# Patient Record
Sex: Female | Born: 1950 | Race: White | Hispanic: Refuse to answer | Marital: Married | Smoking: Never smoker
Health system: Southern US, Community
[De-identification: ages and names within clinical notes are randomized; demographics above are authoritative.]

## PROBLEM LIST (undated history)

## (undated) DIAGNOSIS — Z972 Presence of dental prosthetic device (complete) (partial): Secondary | ICD-10-CM

## (undated) DIAGNOSIS — I2510 Atherosclerotic heart disease of native coronary artery without angina pectoris: Secondary | ICD-10-CM

## (undated) DIAGNOSIS — R7303 Prediabetes: Secondary | ICD-10-CM

## (undated) DIAGNOSIS — I219 Acute myocardial infarction, unspecified: Secondary | ICD-10-CM

## (undated) DIAGNOSIS — Z860101 Personal history of adenomatous and serrated colon polyps: Secondary | ICD-10-CM

## (undated) DIAGNOSIS — I2119 ST elevation (STEMI) myocardial infarction involving other coronary artery of inferior wall: Secondary | ICD-10-CM

## (undated) DIAGNOSIS — E785 Hyperlipidemia, unspecified: Secondary | ICD-10-CM

## (undated) DIAGNOSIS — H409 Unspecified glaucoma: Secondary | ICD-10-CM

## (undated) HISTORY — PX: EYE SURGERY: SHX253

## (undated) HISTORY — PX: ERCP: SHX60

## (undated) HISTORY — PX: CHOLECYSTECTOMY: SHX55

## (undated) HISTORY — PX: COLONOSCOPY: SHX174

## (undated) HISTORY — PX: LIPOMA EXCISION: SHX5283

---

## 2004-09-16 ENCOUNTER — Ambulatory Visit: Payer: Self-pay | Admitting: Internal Medicine

## 2005-05-12 ENCOUNTER — Ambulatory Visit: Payer: Self-pay | Admitting: Unknown Physician Specialty

## 2006-07-07 ENCOUNTER — Ambulatory Visit: Payer: Self-pay | Admitting: Ophthalmology

## 2007-11-02 ENCOUNTER — Ambulatory Visit: Payer: Self-pay | Admitting: Internal Medicine

## 2008-11-07 ENCOUNTER — Ambulatory Visit: Payer: Self-pay | Admitting: Internal Medicine

## 2009-12-26 ENCOUNTER — Ambulatory Visit: Payer: Self-pay | Admitting: Internal Medicine

## 2011-01-29 ENCOUNTER — Ambulatory Visit: Payer: Self-pay | Admitting: Internal Medicine

## 2011-12-04 ENCOUNTER — Ambulatory Visit: Payer: Self-pay | Admitting: Internal Medicine

## 2011-12-09 ENCOUNTER — Ambulatory Visit: Payer: Self-pay | Admitting: Internal Medicine

## 2011-12-09 LAB — CREATININE, SERUM
EGFR (African American): >60
EGFR (Non-African Amer.): >60

## 2011-12-10 ENCOUNTER — Ambulatory Visit: Payer: Self-pay | Admitting: Gastroenterology

## 2011-12-14 LAB — PATHOLOGY REPORT

## 2012-03-19 ENCOUNTER — Ambulatory Visit: Payer: Self-pay | Admitting: Unknown Physician Specialty

## 2012-03-28 ENCOUNTER — Ambulatory Visit: Payer: Self-pay | Admitting: Internal Medicine

## 2013-04-04 ENCOUNTER — Ambulatory Visit: Payer: Self-pay | Admitting: Internal Medicine

## 2013-04-11 ENCOUNTER — Ambulatory Visit: Payer: Self-pay | Admitting: Internal Medicine

## 2014-04-09 ENCOUNTER — Ambulatory Visit: Payer: Self-pay | Admitting: Internal Medicine

## 2015-02-25 ENCOUNTER — Other Ambulatory Visit: Payer: Self-pay | Admitting: Internal Medicine

## 2015-02-25 DIAGNOSIS — Z1231 Encounter for screening mammogram for malignant neoplasm of breast: Secondary | ICD-10-CM

## 2015-04-07 HISTORY — PX: CORONARY ANGIOPLASTY: SHX604

## 2015-04-11 ENCOUNTER — Encounter (INDEPENDENT_AMBULATORY_CARE_PROVIDER_SITE_OTHER): Payer: Self-pay

## 2015-04-11 ENCOUNTER — Ambulatory Visit
Admission: RE | Admit: 2015-04-11 | Discharge: 2015-04-11 | Disposition: A | Discharge status: Home / Self Care | Payer: Medicare Other | Source: Ambulatory Visit | Attending: Internal Medicine | Admitting: Internal Medicine

## 2015-04-11 ENCOUNTER — Other Ambulatory Visit: Payer: Self-pay | Admitting: Internal Medicine

## 2015-04-11 DIAGNOSIS — Z1231 Encounter for screening mammogram for malignant neoplasm of breast: Secondary | ICD-10-CM | POA: Diagnosis present

## 2015-07-06 HISTORY — PX: CARDIAC CATHETERIZATION: SHX172

## 2016-02-19 ENCOUNTER — Other Ambulatory Visit: Payer: Self-pay | Admitting: Internal Medicine

## 2016-02-19 DIAGNOSIS — Z1231 Encounter for screening mammogram for malignant neoplasm of breast: Secondary | ICD-10-CM

## 2016-04-14 ENCOUNTER — Ambulatory Visit
Admission: RE | Admit: 2016-04-14 | Discharge: 2016-04-14 | Disposition: A | Discharge status: Home / Self Care | Payer: Medicare Other | Source: Ambulatory Visit | Attending: Internal Medicine | Admitting: Internal Medicine

## 2016-04-14 DIAGNOSIS — Z1231 Encounter for screening mammogram for malignant neoplasm of breast: Secondary | ICD-10-CM | POA: Diagnosis present

## 2016-04-15 ENCOUNTER — Other Ambulatory Visit: Payer: Self-pay | Admitting: Internal Medicine

## 2016-04-15 DIAGNOSIS — R928 Other abnormal and inconclusive findings on diagnostic imaging of breast: Secondary | ICD-10-CM

## 2016-04-15 DIAGNOSIS — N6310 Unspecified lump in the right breast, unspecified quadrant: Secondary | ICD-10-CM

## 2016-05-12 ENCOUNTER — Ambulatory Visit
Admission: RE | Admit: 2016-05-12 | Discharge: 2016-05-12 | Disposition: A | Discharge status: Home / Self Care | Payer: Medicare Other | Source: Ambulatory Visit | Attending: Internal Medicine | Admitting: Internal Medicine

## 2016-05-12 DIAGNOSIS — N641 Fat necrosis of breast: Secondary | ICD-10-CM | POA: Insufficient documentation

## 2016-05-12 DIAGNOSIS — N6310 Unspecified lump in the right breast, unspecified quadrant: Secondary | ICD-10-CM

## 2016-05-12 DIAGNOSIS — R928 Other abnormal and inconclusive findings on diagnostic imaging of breast: Secondary | ICD-10-CM | POA: Diagnosis present

## 2016-05-13 ENCOUNTER — Other Ambulatory Visit: Payer: Self-pay | Admitting: Internal Medicine

## 2016-05-13 DIAGNOSIS — N6310 Unspecified lump in the right breast, unspecified quadrant: Secondary | ICD-10-CM

## 2016-08-12 ENCOUNTER — Ambulatory Visit: Payer: Medicare Other

## 2016-08-12 ENCOUNTER — Other Ambulatory Visit: Payer: Medicare Other

## 2016-08-13 ENCOUNTER — Ambulatory Visit
Admission: RE | Admit: 2016-08-13 | Discharge: 2016-08-13 | Disposition: A | Discharge status: Home / Self Care | Payer: Medicare Other | Source: Ambulatory Visit | Attending: Internal Medicine | Admitting: Internal Medicine

## 2016-08-13 DIAGNOSIS — N6310 Unspecified lump in the right breast, unspecified quadrant: Secondary | ICD-10-CM

## 2017-03-10 ENCOUNTER — Other Ambulatory Visit: Payer: Self-pay | Admitting: Internal Medicine

## 2017-03-10 DIAGNOSIS — Z1231 Encounter for screening mammogram for malignant neoplasm of breast: Secondary | ICD-10-CM

## 2017-04-28 ENCOUNTER — Ambulatory Visit
Admission: RE | Admit: 2017-04-28 | Discharge: 2017-04-28 | Disposition: A | Discharge status: Home / Self Care | Payer: Medicare Other | Source: Ambulatory Visit | Attending: Internal Medicine | Admitting: Internal Medicine

## 2017-04-28 DIAGNOSIS — Z1231 Encounter for screening mammogram for malignant neoplasm of breast: Secondary | ICD-10-CM | POA: Insufficient documentation

## 2017-11-16 ENCOUNTER — Encounter: Payer: Self-pay | Admitting: *Deleted

## 2017-11-17 ENCOUNTER — Ambulatory Visit: Payer: Medicare Other | Admitting: Anesthesiology

## 2017-11-17 ENCOUNTER — Ambulatory Visit
Admission: RE | Admit: 2017-11-17 | Discharge: 2017-11-17 | Disposition: A | Discharge status: Home / Self Care | Payer: Medicare Other | Source: Ambulatory Visit | Attending: Unknown Physician Specialty | Admitting: Unknown Physician Specialty

## 2017-11-17 ENCOUNTER — Encounter: Payer: Self-pay | Admitting: Anesthesiology

## 2017-11-17 ENCOUNTER — Encounter
Admission: RE | Disposition: A | Discharge status: Home / Self Care | Payer: Self-pay | Source: Ambulatory Visit | Attending: Unknown Physician Specialty

## 2017-11-17 DIAGNOSIS — K640 First degree hemorrhoids: Secondary | ICD-10-CM | POA: Insufficient documentation

## 2017-11-17 DIAGNOSIS — Z79899 Other long term (current) drug therapy: Secondary | ICD-10-CM | POA: Diagnosis not present

## 2017-11-17 DIAGNOSIS — Z7982 Long term (current) use of aspirin: Secondary | ICD-10-CM | POA: Insufficient documentation

## 2017-11-17 DIAGNOSIS — I2510 Atherosclerotic heart disease of native coronary artery without angina pectoris: Secondary | ICD-10-CM | POA: Insufficient documentation

## 2017-11-17 DIAGNOSIS — Z881 Allergy status to other antibiotic agents status: Secondary | ICD-10-CM | POA: Diagnosis not present

## 2017-11-17 DIAGNOSIS — Z880 Allergy status to penicillin: Secondary | ICD-10-CM | POA: Diagnosis not present

## 2017-11-17 DIAGNOSIS — Z86010 Personal history of colonic polyps: Secondary | ICD-10-CM | POA: Insufficient documentation

## 2017-11-17 DIAGNOSIS — Z955 Presence of coronary angioplasty implant and graft: Secondary | ICD-10-CM | POA: Insufficient documentation

## 2017-11-17 DIAGNOSIS — Z1211 Encounter for screening for malignant neoplasm of colon: Secondary | ICD-10-CM | POA: Diagnosis not present

## 2017-11-17 DIAGNOSIS — I252 Old myocardial infarction: Secondary | ICD-10-CM | POA: Diagnosis not present

## 2017-11-17 DIAGNOSIS — H409 Unspecified glaucoma: Secondary | ICD-10-CM | POA: Insufficient documentation

## 2017-11-17 DIAGNOSIS — K5730 Diverticulosis of large intestine without perforation or abscess without bleeding: Secondary | ICD-10-CM | POA: Diagnosis not present

## 2017-11-17 HISTORY — DX: Atherosclerotic heart disease of native coronary artery without angina pectoris: I25.10

## 2017-11-17 HISTORY — DX: Unspecified glaucoma: H40.9

## 2017-11-17 HISTORY — DX: Acute myocardial infarction, unspecified: I21.9

## 2017-11-17 HISTORY — PX: COLONOSCOPY WITH PROPOFOL: SHX5780

## 2017-11-17 SURGERY — COLONOSCOPY WITH PROPOFOL
Anesthesia: General

## 2017-11-17 MED ORDER — LIDOCAINE HCL (PF) 2 % IJ SOLN
INTRAMUSCULAR | Status: AC
  Filled 2017-11-17: qty 10

## 2017-11-17 MED ORDER — PROPOFOL 500 MG/50ML IV EMUL
INTRAVENOUS | Status: AC
  Filled 2017-11-17: qty 50

## 2017-11-17 MED ORDER — LIDOCAINE HCL (PF) 1 % IJ SOLN
INTRAMUSCULAR | Status: AC
  Filled 2017-11-17: qty 2

## 2017-11-17 MED ORDER — SODIUM CHLORIDE 0.9 % IV SOLN
INTRAVENOUS | Status: DC
  Administered 2017-11-17: 11:00:00 via INTRAVENOUS

## 2017-11-17 MED ORDER — PROPOFOL 10 MG/ML IV BOLUS
INTRAVENOUS | Status: DC | PRN
  Administered 2017-11-17: 20 mg via INTRAVENOUS
  Administered 2017-11-17: 30 mg via INTRAVENOUS

## 2017-11-17 MED ORDER — FENTANYL CITRATE (PF) 100 MCG/2ML IJ SOLN
INTRAMUSCULAR | Status: DC | PRN
  Administered 2017-11-17 (×2): 50 ug via INTRAVENOUS

## 2017-11-17 MED ORDER — PROPOFOL 500 MG/50ML IV EMUL
INTRAVENOUS | Status: DC | PRN
  Administered 2017-11-17: 50 ug/kg/min via INTRAVENOUS

## 2017-11-17 MED ORDER — LIDOCAINE HCL (PF) 1 % IJ SOLN: 2.0000 mL | Freq: Once | INTRAMUSCULAR | Status: DC

## 2017-11-17 MED ORDER — MIDAZOLAM HCL 5 MG/5ML IJ SOLN
INTRAMUSCULAR | Status: DC | PRN
  Administered 2017-11-17: 2 mg via INTRAVENOUS

## 2017-11-17 MED ORDER — FENTANYL CITRATE (PF) 100 MCG/2ML IJ SOLN
INTRAMUSCULAR | Status: AC
  Filled 2017-11-17: qty 2

## 2017-11-17 MED ORDER — LIDOCAINE HCL (PF) 2 % IJ SOLN
INTRAMUSCULAR | Status: DC | PRN
  Administered 2017-11-17: 60 mg

## 2017-11-17 MED ORDER — MIDAZOLAM HCL 2 MG/2ML IJ SOLN
INTRAMUSCULAR | Status: AC
  Filled 2017-11-17: qty 2

## 2017-11-17 NOTE — Anesthesia Postprocedure Evaluation (Signed)
Anesthesia Post Note  Patient: Autumn RacerJanet W Combs  Procedure(s) Performed: COLONOSCOPY WITH PROPOFOL (N/A )  Patient location during evaluation: Endoscopy Anesthesia Type: General Level of consciousness: awake and alert Pain management: pain level controlled Vital Signs Assessment: post-procedure vital signs reviewed and stable Respiratory status: spontaneous breathing, nonlabored ventilation, respiratory function stable and patient connected to nasal cannula oxygen Cardiovascular status: blood pressure returned to baseline and stable Postop Assessment: no apparent nausea or vomiting Anesthetic complications: no     Last Vitals:  Vitals:   11/17/17 1205 11/17/17 1215  BP: 106/71 (!) 99/47  Pulse: 65 60  Resp: 17 (!) 9  Temp:    SpO2: 97% 99%    Last Pain:  Vitals:   11/17/17 1215  TempSrc:   PainSc: 0-No pain                 Lenard SimmerAndrew Ercell Perlman

## 2017-11-17 NOTE — Transfer of Care (Signed)
Immediate Anesthesia Transfer of Care Note  Patient: Loren RacerJanet W Maestas  Procedure(s) Performed: COLONOSCOPY WITH PROPOFOL (N/A )  Patient Location: PACU  Anesthesia Type:General  Level of Consciousness: awake, alert  and oriented  Airway & Oxygen Therapy: Patient Spontanous Breathing  Post-op Assessment: Report given to RN and Post -op Vital signs reviewed and stable  Post vital signs: Reviewed and stable  Last Vitals:  Vitals Value Taken Time  BP 100/56 11/17/2017 11:52 AM  Temp 36.2 C 11/17/2017 11:45 AM  Pulse 81 11/17/2017 11:52 AM  Resp 15 11/17/2017 11:52 AM  SpO2 94 % 11/17/2017 11:52 AM  Vitals shown include unvalidated device data.  Last Pain:  Vitals:   11/17/17 1027  TempSrc: Tympanic         Complications: No apparent anesthesia complications

## 2017-11-17 NOTE — Op Note (Signed)
[facility] Gastroenterology Patient Name: Autumn Combs Procedure Date: 11/17/2017 11:06 AM MRN: [id number] Account #: [id number] Date of Birth: 02/10/1951 Admit Type: Outpatient Age: [age] [facility] ROOM 3 Gender: Female Note Status: Finalized Procedure:            Colonoscopy Indications:          High risk colon cancer surveillance: Personal history                        of colonic polyps Providers:            Scot Junobert T. Elliott, MD Referring MD:         Marya AmslerMarshall W. Dareen PianoAnderson MD, MD (Referring MD) Medicines:            Propofol per Anesthesia Complications:        No immediate complications. Procedure:            Pre-Anesthesia Assessment:                       - After reviewing the risks and benefits, the patient                        was deemed in satisfactory condition to undergo the                        procedure.                       After obtaining informed consent, the colonoscope was                        passed under direct vision. Throughout the procedure,                        the patient's blood pressure, pulse, and oxygen                        saturations were monitored continuously. The                        Colonoscope was introduced through the anus and                        advanced to the the cecum, identified by appendiceal                        orifice and ileocecal valve. The colonoscopy was                        performed with difficulty due to a tortuous colon.                        Successful completion of the procedure was aided by                        applying abdominal pressure. The patient tolerated the                        procedure well. Findings:      A few small-medium-mouthed diverticula were found in the sigmoid colon  and descending colon.      Internal hemorrhoids were found during endoscopy. The hemorrhoids were       small and Grade I (internal hemorrhoids that do not prolapse).      The recto  sigmoid was extremly difficult due to sharp turn.      after changes in position I used a water lavage of the area to make       passage possible. Impression:           - No specimens collected. Recommendation:       - Repeat colonoscopy in 5 years for surveillance. Scot Junobert T Elliott, MD 11/17/2017 11:48:33 AM This report has been signed electronically. Number of Addenda: 0 Note Initiated On: 11/17/2017 11:06 AM Scope Withdrawal Time: 0 hours 5 minutes 32 seconds  Total Procedure Duration: 0 hours 24 minutes 18 seconds       [facility]

## 2017-11-17 NOTE — Anesthesia Post-op Follow-up Note (Signed)
Anesthesia QCDR form completed.        

## 2017-11-17 NOTE — Anesthesia Preprocedure Evaluation (Addendum)
Anesthesia Evaluation  Patient identified by MRN, date of birth, ID band Patient awake    Reviewed: Allergy & Precautions, H&P , NPO status , Patient's Chart, lab work & pertinent test results, reviewed documented beta blocker date and time   History of Anesthesia Complications Negative for: history of anesthetic complications  Airway Mallampati: II  TM Distance: >3 FB Neck ROM: full    Dental  (+) Dental Advidsory Given, Teeth Intact, Caps   Pulmonary neg pulmonary ROS,           Cardiovascular Exercise Tolerance: Good (-) hypertension(-) angina+ CAD, + Past MI and + Cardiac Stents  (-) CABG (-) dysrhythmias (-) Valvular Problems/Murmurs     Neuro/Psych negative neurological ROS  negative psych ROS   GI/Hepatic negative GI ROS, Neg liver ROS,   Endo/Other  negative endocrine ROS  Renal/GU negative Renal ROS  negative genitourinary   Musculoskeletal   Abdominal   Peds  Hematology negative hematology ROS (+)   Anesthesia Other Findings Past Medical History: No date: Coronary artery disease No date: Glaucoma No date: Myocardial infarction (HCC)   Reproductive/Obstetrics negative OB ROS                            Anesthesia Physical Anesthesia Plan  ASA: II  Anesthesia Plan: General   Post-op Pain Management:    Induction: Intravenous  PONV Risk Score and Plan: 3 and Propofol infusion and TIVA  Airway Management Planned: Nasal Cannula  Additional Equipment:   Intra-op Plan:   Post-operative Plan:   Informed Consent: I have reviewed the patients History and Physical, chart, labs and discussed the procedure including the risks, benefits and alternatives for the proposed anesthesia with the patient or authorized representative who has indicated his/her understanding and acceptance.   Dental Advisory Given  Plan Discussed with: Anesthesiologist, CRNA and Surgeon  Anesthesia  Plan Comments:         Anesthesia Quick Evaluation

## 2017-11-22 ENCOUNTER — Encounter: Payer: Self-pay | Admitting: Unknown Physician Specialty

## 2017-12-01 NOTE — H&P (Signed)
Primary Care Physician:  Lauro RegulusAnderson, Marshall W, MD Primary Gastroenterologist:  Dr. Mechele CollinElliott  Pre-Procedure History & Physical: HPI:  Autumn Combs is a 67 y.o. female is here for an colonoscopy. Due to personal hx of   Past Medical History:  Diagnosis Date  . Coronary artery disease   . Glaucoma   . Myocardial infarction [facility](HCC)     Past Surgical History:  Procedure Laterality Date  . CHOLECYSTECTOMY    . COLONOSCOPY    . COLONOSCOPY WITH PROPOFOL N/A 11/17/2017   Procedure: COLONOSCOPY WITH PROPOFOL;  Surgeon: Scot JunElliott, Robert T, MD;  [facility];  Service: Endoscopy;  Laterality: N/A;  Hx:  STEMI, CAD  . ERCP    . LIPOMA EXCISION      Prior to Admission medications   Medication Sig Start Date End Date Taking? Authorizing Provider  aspirin EC 81 MG tablet Take 81 mg by mouth daily.   Yes [provider]  atorvastatin (LIPITOR) 80 MG tablet Take 80 mg by mouth daily.   Yes [provider]  bimatoprost (LUMIGAN) 0.03 % ophthalmic solution 1 drop at bedtime.   Yes [provider]    Allergies as of 09/09/2017 - never reviewed  Allergen Reaction Noted  . Cleocin [clindamycin hcl] Swelling and Rash 04/11/2015  . Lincocin [lincomycin] Swelling and Rash 04/11/2015  . Penicillins Swelling and Rash 04/11/2015    Family History  Problem Relation Age of Onset  . Breast cancer Neg Hx     Social History   Socioeconomic History  . Marital status: Married    Spouse name: Not on file  . Number of children: Not on file  . Years of education: Not on file  . Highest education level: Not on file  Occupational History  . Not on file  Social Needs  . Financial resource strain: Not on file  . Food insecurity:    Worry: Not on file    Inability: Not on file  . Transportation needs:    Medical: Not on file    Non-medical: Not on file  Tobacco Use  . Smoking status: Never Smoker  . Smokeless tobacco: Never Used  Substance and Sexual Activity   . Alcohol use: Yes    Alcohol/week: 2.0 standard drinks    Types: 1 Glasses of wine, 1 Shots of liquor per week  . Drug use: Never  . Sexual activity: Not on file  Lifestyle  . Physical activity:    Days per week: Not on file    Minutes per session: Not on file  . Stress: Not on file  Relationships  . Social connections:    Talks on phone: Not on file    Gets together: Not on file    Attends religious service: Not on file    Active member of club or organization: Not on file    Attends meetings of clubs or organizations: Not on file    Relationship status: Not on file  . Intimate partner violence:    Fear of current or ex partner: Not on file    Emotionally abused: Not on file    Physically abused: Not on file    Forced sexual activity: Not on file  Other Topics Concern  . Not on file  Social History Narrative  . Not on file    Review of Systems: See HPI, otherwise negative ROS  Physical Exam: BP (!) 99/47   Pulse 60   Temp (!) 97.1 F (36.2 C)  Resp (!) 9   Ht 5\' 8"  (1.727 m)   Wt 96.6 kg   SpO2 99%   BMI 32.39 kg/m  General:   Alert,  pleasant and cooperative in NAD Head:  Normocephalic and atraumatic. Neck:  Supple; no masses or thyromegaly. Lungs:  Clear throughout to auscultation.    Heart:  Regular rate and rhythm. Abdomen:  Soft, nontender and nondistended. Normal bowel sounds, without guarding, and without rebound.   Neurologic:  Alert and  oriented x4;  grossly normal neurologically.  Impression/Plan: Autumn Combs is here for an colonoscopy to be performed for personal history of colon polyps.  Risks, benefits, limitations, and alternatives regarding  colonoscopy have been reviewed with the patient.  Questions have been answered.  All parties agreeable.   Autumn Prude, MD  12/01/2017, 4:49 PM

## 2018-02-27 IMAGING — MG MM DIGITAL DIAGNOSTIC UNILAT*R* W/ TOMO W/ CAD
6 series · 6 of 14 positions shown · non-contrast
Comparison: Previous exams including recent screening mammogram
dated 04/14/2016.

ACR Breast Density Category a: The breast tissue is almost entirely
fatty.

CLINICAL DATA: Patient returns today to evaluate a possible right
breast mass identified on recent screening mammogram.

EXAM:
2D DIGITAL DIAGNOSTIC RIGHT MAMMOGRAM WITH CAD AND ADJUNCT TOMO
ULTRASOUND RIGHT BREAST

[R CC synth-2D]
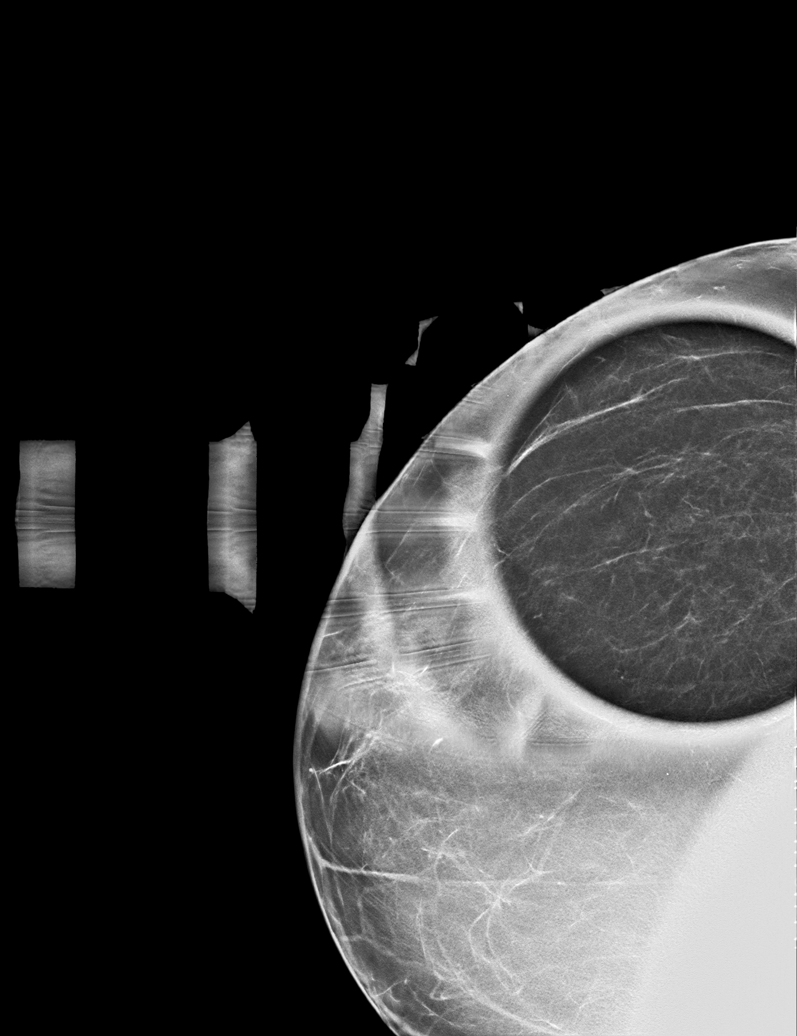

[R MLO]
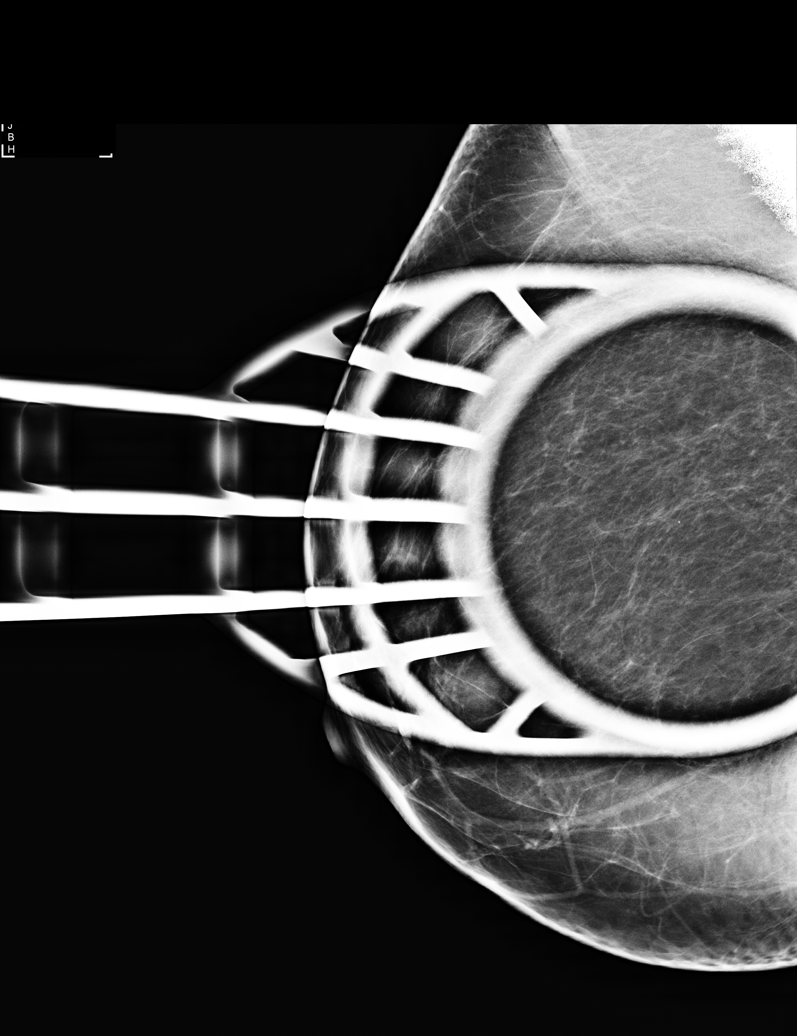

[R MLO synth-2D]
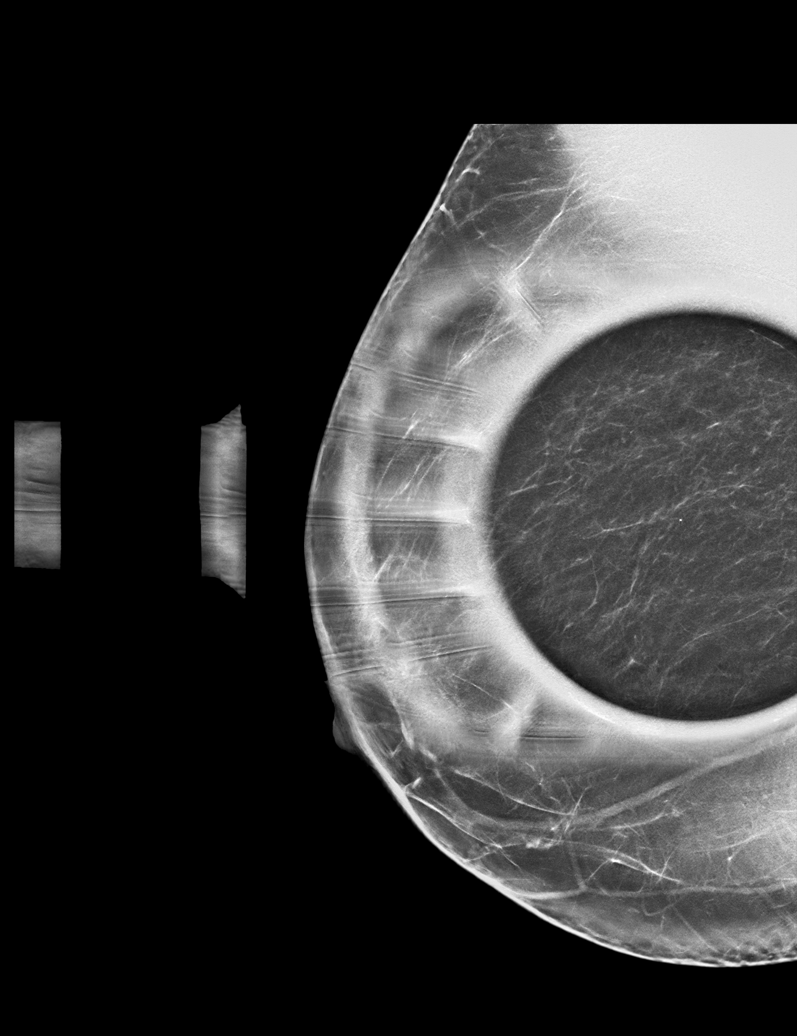

[R CC]
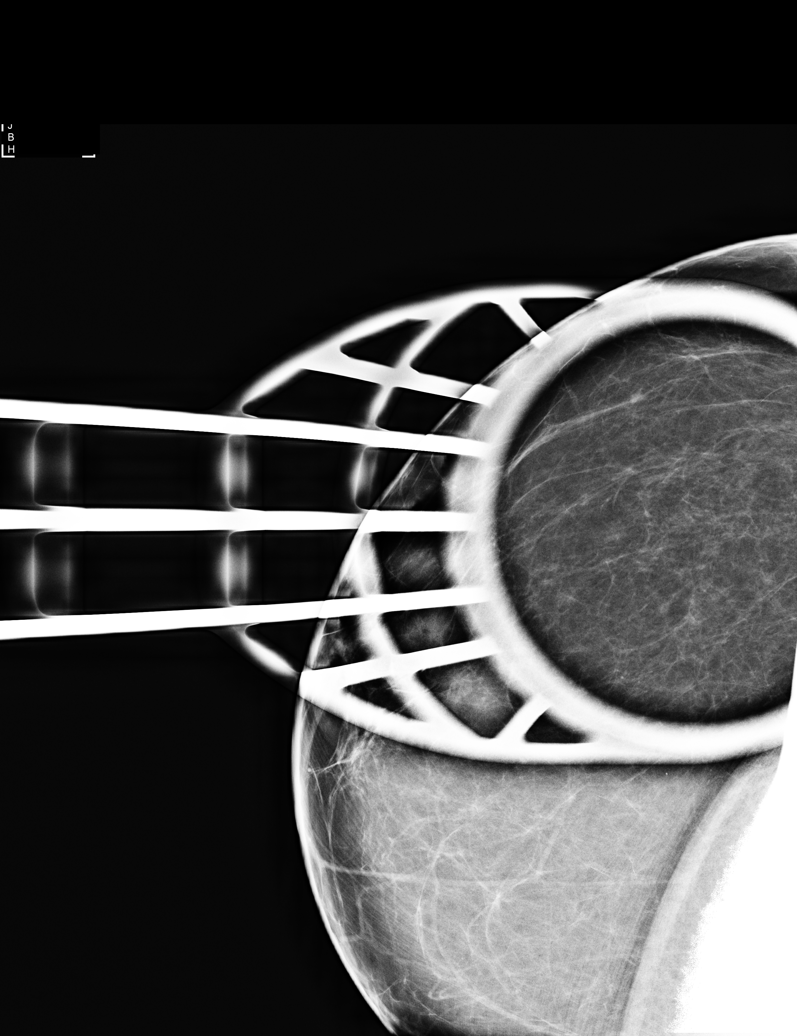

[R CC tomo · tomo slice 25/50.0]
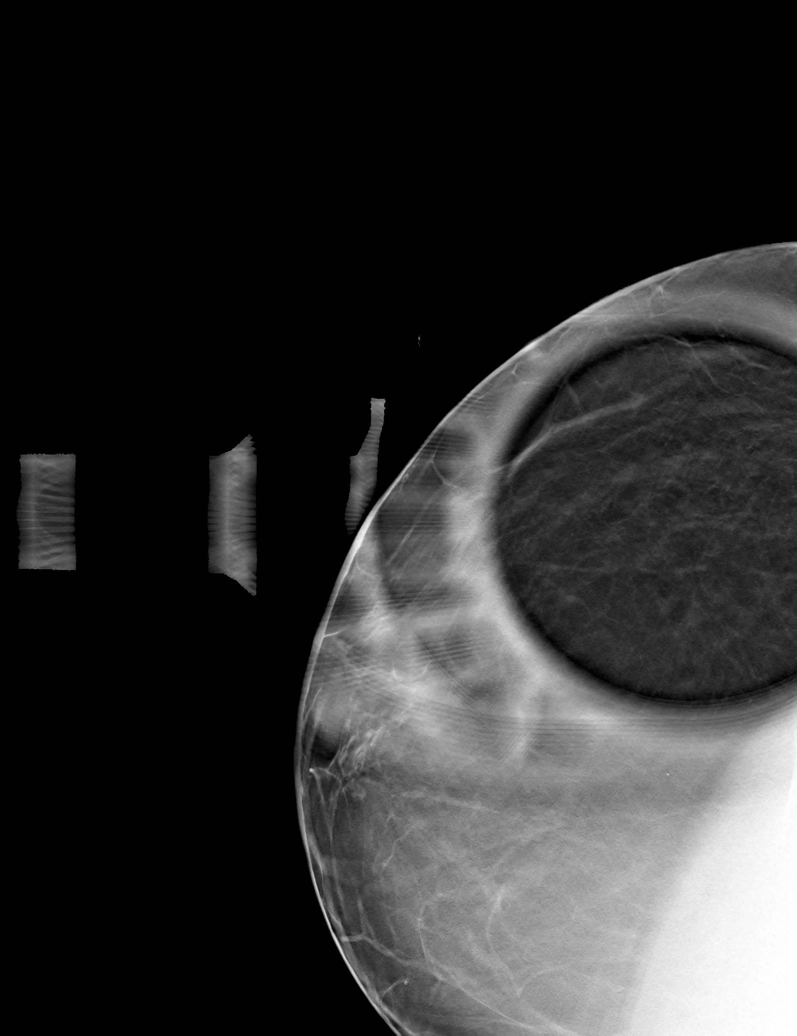

[R MLO tomo · tomo slice 31/60.0]
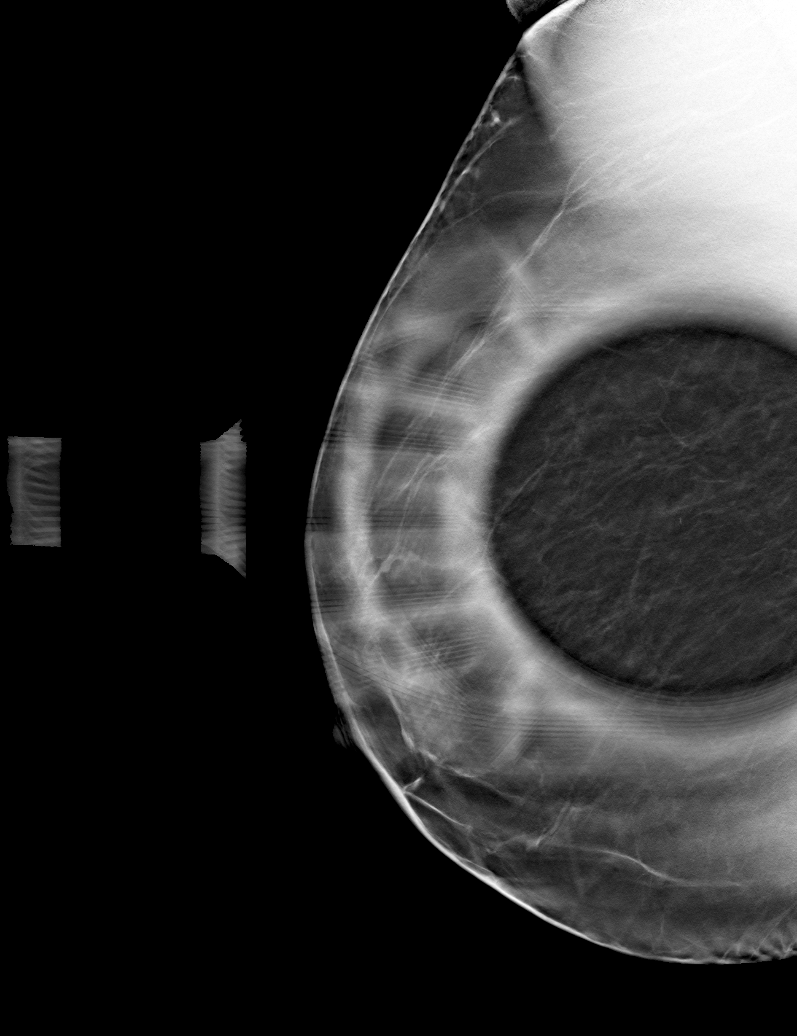

[6 of 14 positions shown; findings below may reference images not displayed]

FINDINGS: On today's additional views of the right breast with spot
compression and 3D tomosynthesis, an oval dense circumscribed mass
is confirmed within the lower outer quadrant of the right breast, at
middle depth, measuring approximately 4 mm greatest dimension, most
likely superficial based on tomosynthesis slice position, possibly
within the skin.

Mammographic images were processed with CAD.

Targeted ultrasound is performed, evaluating the lower outer
quadrant of the right breast corresponding to the area of
mammographic finding, showing a corresponding mixed echogenicity
mass (echogenic peripherally with cystic appearing focus centrally),
located at the 8 o'clock axis, 5 cm from the nipple, measuring 3 x 3
x 3 mm, most suggestive of benign fat necrosis.
IMPRESSION: Probably benign fat necrosis within the right breast at the 8
o'clock axis, 5 cm from the nipple, measuring 3 x 3 x 3 mm,
corresponding to the mammographic finding. Recommend follow-up right
breast diagnostic mammogram and possible ultrasound in 3 months.

RECOMMENDATION:
Follow right breast diagnostic mammogram, and possible ultrasound,
in 3 months.

I have discussed the findings and recommendations with the patient.
Results were also provided in writing at the conclusion of the
visit. If applicable, a reminder letter will be sent to the patient
regarding the next appointment.

BI-RADS CATEGORY  3: Probably benign.

## 2018-04-07 ENCOUNTER — Other Ambulatory Visit: Payer: Self-pay | Admitting: Internal Medicine

## 2018-04-07 DIAGNOSIS — Z1231 Encounter for screening mammogram for malignant neoplasm of breast: Secondary | ICD-10-CM

## 2018-05-03 ENCOUNTER — Ambulatory Visit
Admission: RE | Admit: 2018-05-03 | Discharge: 2018-05-03 | Disposition: A | Discharge status: Home / Self Care | Payer: Medicare Other | Source: Ambulatory Visit | Attending: Internal Medicine | Admitting: Internal Medicine

## 2018-05-03 DIAGNOSIS — Z1231 Encounter for screening mammogram for malignant neoplasm of breast: Secondary | ICD-10-CM | POA: Diagnosis present

## 2018-10-19 ENCOUNTER — Other Ambulatory Visit: Payer: Self-pay

## 2018-10-19 ENCOUNTER — Encounter: Payer: Self-pay | Admitting: *Deleted

## 2018-10-19 NOTE — Anesthesia Preprocedure Evaluation (Addendum)
Anesthesia Evaluation  Patient identified by MRN, date of birth, ID band Patient awake    Reviewed: Allergy & Precautions, NPO status , Patient's Chart, lab work & pertinent test results  History of Anesthesia Complications Negative for: history of anesthetic complications  Airway Mallampati: III   Neck ROM: Full    Dental no notable dental hx.    Pulmonary neg pulmonary ROS,    Pulmonary exam normal breath sounds clear to auscultation       Cardiovascular + CAD and + Past MI  Normal cardiovascular exam Rhythm:Regular Rate:Normal  07/2015 Inferior STEMI. Anginal Equivalent: color gray, L chest pain a/w DOE. PCI of occluded prox RCA w/ Promus DES  07/2015 Echo: EF 50%, mild concen LVH, no VHD     Neuro/Psych negative neurological ROS     GI/Hepatic negative GI ROS,   Endo/Other  negative endocrine ROS  Renal/GU negative Renal ROS     Musculoskeletal   Abdominal   Peds  Hematology negative hematology ROS (+)   Anesthesia Other Findings   Reproductive/Obstetrics                           Anesthesia Physical Anesthesia Plan  ASA: II  Anesthesia Plan: MAC   Post-op Pain Management:    Induction: Intravenous  PONV Risk Score and Plan: 2 and TIVA and Midazolam  Airway Management Planned: Natural Airway  Additional Equipment:   Intra-op Plan:   Post-operative Plan:   Informed Consent: I have reviewed the patients History and Physical, chart, labs and discussed the procedure including the risks, benefits and alternatives for the proposed anesthesia with the patient or authorized representative who has indicated his/her understanding and acceptance.       Plan Discussed with: CRNA  Anesthesia Plan Comments:        Anesthesia Quick Evaluation

## 2018-10-21 ENCOUNTER — Other Ambulatory Visit: Payer: Self-pay

## 2018-10-21 ENCOUNTER — Other Ambulatory Visit
Admission: RE | Admit: 2018-10-21 | Discharge: 2018-10-21 | Disposition: A | Discharge status: Home / Self Care | Payer: Medicare Other | Source: Ambulatory Visit | Attending: Ophthalmology | Admitting: Ophthalmology

## 2018-10-21 DIAGNOSIS — Z1159 Encounter for screening for other viral diseases: Secondary | ICD-10-CM | POA: Insufficient documentation

## 2018-10-21 LAB — SARS CORONAVIRUS 2 (TAT 6-24 HRS): SARS Coronavirus 2: NEGATIVE

## 2018-10-21 NOTE — Discharge Instructions (Signed)

## 2018-10-26 ENCOUNTER — Ambulatory Visit: Payer: Medicare Other | Admitting: Anesthesiology

## 2018-10-26 ENCOUNTER — Encounter
Admission: RE | Disposition: A | Discharge status: Home / Self Care | Payer: Self-pay | Source: Home / Self Care | Attending: Ophthalmology

## 2018-10-26 ENCOUNTER — Ambulatory Visit
Admission: RE | Admit: 2018-10-26 | Discharge: 2018-10-26 | Disposition: A | Discharge status: Home / Self Care | Payer: Medicare Other | Attending: Ophthalmology | Admitting: Ophthalmology

## 2018-10-26 DIAGNOSIS — Z950 Presence of cardiac pacemaker: Secondary | ICD-10-CM | POA: Diagnosis not present

## 2018-10-26 DIAGNOSIS — Z880 Allergy status to penicillin: Secondary | ICD-10-CM | POA: Insufficient documentation

## 2018-10-26 DIAGNOSIS — Z79899 Other long term (current) drug therapy: Secondary | ICD-10-CM | POA: Diagnosis not present

## 2018-10-26 DIAGNOSIS — Z7982 Long term (current) use of aspirin: Secondary | ICD-10-CM | POA: Insufficient documentation

## 2018-10-26 DIAGNOSIS — Z955 Presence of coronary angioplasty implant and graft: Secondary | ICD-10-CM | POA: Insufficient documentation

## 2018-10-26 DIAGNOSIS — H2512 Age-related nuclear cataract, left eye: Secondary | ICD-10-CM | POA: Insufficient documentation

## 2018-10-26 DIAGNOSIS — I252 Old myocardial infarction: Secondary | ICD-10-CM | POA: Diagnosis not present

## 2018-10-26 DIAGNOSIS — Z881 Allergy status to other antibiotic agents status: Secondary | ICD-10-CM | POA: Insufficient documentation

## 2018-10-26 DIAGNOSIS — I2510 Atherosclerotic heart disease of native coronary artery without angina pectoris: Secondary | ICD-10-CM | POA: Insufficient documentation

## 2018-10-26 DIAGNOSIS — E7800 Pure hypercholesterolemia, unspecified: Secondary | ICD-10-CM | POA: Diagnosis not present

## 2018-10-26 HISTORY — PX: CATARACT EXTRACTION W/PHACO: SHX586

## 2018-10-26 HISTORY — DX: Presence of dental prosthetic device (complete) (partial): Z97.2

## 2018-10-26 HISTORY — DX: Hyperlipidemia, unspecified: E78.5

## 2018-10-26 SURGERY — PHACOEMULSIFICATION, CATARACT, WITH IOL INSERTION
Anesthesia: Monitor Anesthesia Care | Site: Eye | Laterality: Left

## 2018-10-26 MED ORDER — NA HYALUR & NA CHOND-NA HYALUR 0.4-0.35 ML IO KIT
PACK | INTRAOCULAR | Status: DC | PRN
  Administered 2018-10-26: 1 mL via INTRAOCULAR

## 2018-10-26 MED ORDER — LIDOCAINE HCL (PF) 2 % IJ SOLN
INTRAOCULAR | Status: DC | PRN
  Administered 2018-10-26: 1 mL

## 2018-10-26 MED ORDER — FENTANYL CITRATE (PF) 100 MCG/2ML IJ SOLN
INTRAMUSCULAR | Status: DC | PRN
  Administered 2018-10-26: 50 ug via INTRAVENOUS

## 2018-10-26 MED ORDER — MOXIFLOXACIN HCL 0.5 % OP SOLN
OPHTHALMIC | Status: DC | PRN
  Administered 2018-10-26: 0.2 mL via OPHTHALMIC

## 2018-10-26 MED ORDER — EPINEPHRINE PF 1 MG/ML IJ SOLN
INTRAOCULAR | Status: DC | PRN
  Administered 2018-10-26: 13:00:00 51 mL via OPHTHALMIC

## 2018-10-26 MED ORDER — MOXIFLOXACIN HCL 0.5 % OP SOLN
1.0000 [drp] | OPHTHALMIC | Status: DC | PRN
  Administered 2018-10-26 (×3): 1 [drp] via OPHTHALMIC

## 2018-10-26 MED ORDER — TETRACAINE HCL 0.5 % OP SOLN
1.0000 [drp] | OPHTHALMIC | Status: DC | PRN
  Administered 2018-10-26 (×3): 1 [drp] via OPHTHALMIC

## 2018-10-26 MED ORDER — BRIMONIDINE TARTRATE-TIMOLOL 0.2-0.5 % OP SOLN
OPHTHALMIC | Status: DC | PRN
  Administered 2018-10-26: 1 [drp] via OPHTHALMIC

## 2018-10-26 MED ORDER — ARMC OPHTHALMIC DILATING DROPS
1.0000 "application " | OPHTHALMIC | Status: DC | PRN
  Administered 2018-10-26 (×3): 1 via OPHTHALMIC

## 2018-10-26 MED ORDER — MIDAZOLAM HCL 2 MG/2ML IJ SOLN
INTRAMUSCULAR | Status: DC | PRN
  Administered 2018-10-26: 1 mg via INTRAVENOUS

## 2018-10-26 SURGICAL SUPPLY — 20 items
ACRYSOF IQ TORIC ASTIGMATISM IOL (Intraocular Lens) ×2 IMPLANT
CANNULA ANT/CHMB 27G (MISCELLANEOUS) ×1 IMPLANT
CANNULA ANT/CHMB 27GA (MISCELLANEOUS) ×3 IMPLANT
GLOVE SURG LX 7.5 STRW (GLOVE) ×2
GLOVE SURG LX STRL 7.5 STRW (GLOVE) ×1 IMPLANT
GLOVE SURG TRIUMPH 8.0 PF LTX (GLOVE) ×3 IMPLANT
GOWN STRL REUS W/ TWL LRG LVL3 (GOWN DISPOSABLE) ×2 IMPLANT
GOWN STRL REUS W/TWL LRG LVL3 (GOWN DISPOSABLE) ×4
MARKER SKIN DUAL TIP RULER LAB (MISCELLANEOUS) ×3 IMPLANT
NDL FILTER BLUNT 18X1 1/2 (NEEDLE) ×1 IMPLANT
NEEDLE FILTER BLUNT 18X 1/2SAF (NEEDLE) ×2
NEEDLE FILTER BLUNT 18X1 1/2 (NEEDLE) ×1 IMPLANT
PACK CATARACT BRASINGTON (MISCELLANEOUS) ×3 IMPLANT
PACK EYE AFTER SURG (MISCELLANEOUS) ×3 IMPLANT
PACK OPTHALMIC (MISCELLANEOUS) ×3 IMPLANT
SYR 3ML LL SCALE MARK (SYRINGE) ×3 IMPLANT
SYR 5ML LL (SYRINGE) ×3 IMPLANT
SYR TB 1ML LUER SLIP (SYRINGE) ×3 IMPLANT
WATER STERILE IRR 500ML POUR (IV SOLUTION) ×3 IMPLANT
WIPE NON LINTING 3.25X3.25 (MISCELLANEOUS) ×3 IMPLANT

## 2018-10-26 NOTE — H&P (Signed)

## 2018-10-26 NOTE — Anesthesia Postprocedure Evaluation (Signed)
Anesthesia Post Note  Patient: Autumn Combs  Procedure(s) Performed: CATARACT EXTRACTION PHACO AND INTRAOCULAR LENS PLACEMENT (IOC) LEFT TORIC LENS (Left Eye)  Patient location during evaluation: PACU Anesthesia Type: MAC Level of consciousness: awake and alert, oriented and patient cooperative Pain management: pain level controlled Vital Signs Assessment: post-procedure vital signs reviewed and stable Respiratory status: spontaneous breathing, nonlabored ventilation and respiratory function stable Cardiovascular status: blood pressure returned to baseline and stable Postop Assessment: adequate PO intake Anesthetic complications: no    Darrin Nipper

## 2018-10-26 NOTE — Transfer of Care (Signed)
Immediate Anesthesia Transfer of Care Note  Patient: Autumn Combs  Procedure(s) Performed: CATARACT EXTRACTION PHACO AND INTRAOCULAR LENS PLACEMENT (IOC) LEFT TORIC LENS (Left Eye)  Patient Location: PACU  Anesthesia Type: MAC  Level of Consciousness: awake, alert  and patient cooperative  Airway and Oxygen Therapy: Patient Spontanous Breathing and Patient connected to supplemental oxygen  Post-op Assessment: Post-op Vital signs reviewed, Patient's Cardiovascular Status Stable, Respiratory Function Stable, Patent Airway and No signs of Nausea or vomiting  Post-op Vital Signs: Reviewed and stable  Complications: No apparent anesthesia complications

## 2018-10-26 NOTE — Op Note (Signed)
LOCATION:  [place]   PREOPERATIVE DIAGNOSIS:  Nuclear sclerotic cataract of the left eye.  H25.12  POSTOPERATIVE DIAGNOSIS:  Nuclear sclerotic cataract of the left eye.   PROCEDURE:  Phacoemulsification with Toric posterior chamber intraocular lens placement of the left eye.   LENS:  Implant Name Type Inv. Item Serial No. Manufacturer Lot No. LRB No. Used Action  ACRYSOF IQ TORIC ASTIGMATISM IOL Intraocular Lens  [id number]   Left 1 Implanted   [id number] 10.5 Toric intraocular lens with 1.5 diopters of cylindrical power with axis orientation at 96 degrees.   ULTRASOUND TIME: 14 % of 0 minutes, 58 seconds.  CDE 8.4   SURGEON:  Wyonia Hough, MD   ANESTHESIA:  Topical with tetracaine drops and 2% Xylocaine jelly, augmented with 1% preservative-free intracameral lidocaine.  COMPLICATIONS:  None.   DESCRIPTION OF PROCEDURE:  The patient was identified in the holding room and transported to the operating suite and placed in the supine position under the operating microscope.  The left eye was identified as the operative eye, and it was prepped and draped in the usual sterile ophthalmic fashion.    A clear-corneal paracentesis incision was made at the 1:30 position.  0.5 ml of preservative-free 1% lidocaine was injected into the anterior chamber. The anterior chamber was filled with Viscoat.  A 2.4 millimeter near clear corneal incision was then made at the 10:30 position.  A cystotome and capsulorrhexis forceps were then used to make a curvilinear capsulorrhexis.  Hydrodissection and hydrodelineation were then performed using balanced salt solution.   Phacoemulsification was then used in stop and chop fashion to remove the lens, nucleus and epinucleus.  The remaining cortex was aspirated using the irrigation and aspiration handpiece.  Provisc viscoelastic was then placed into the capsular bag to distend it for lens placement.  The Verion digital marker was used to align the  implant at the intended axis.   A 10.5 diopter lens was then injected into the capsular bag.  It was rotated clockwise until the axis marks on the lens were approximately 15 degrees in the counterclockwise direction to the intended alignment.  The viscoelastic was aspirated from the eye using the irrigation aspiration handpiece.  Then, a Koch spatula through the sideport incision was used to rotate the lens in a clockwise direction until the axis markings of the intraocular lens were lined up with the Verion alignment.  Balanced salt solution was then used to hydrate the wounds. Vigamox 0.2 ml of a 1mg  per ml solution was injected into the anterior chamber for a dose of 0.2 mg of intracameral antibiotic at the completion of the case.    The eye was noted to have a physiologic pressure and there was no wound leak noted. The patient was taken to the recovery room in stable condition having had no complications of anesthesia or surgery.  Kilie Rund 10/26/2018, 1:15 PM

## 2018-10-26 NOTE — Anesthesia Procedure Notes (Signed)
Procedure Name: MAC Date/Time: 10/26/2018 12:55 PM Performed by: Cameron Ali, CRNA Pre-anesthesia Checklist: Patient identified, Emergency Drugs available, Suction available, Timeout performed and Patient being monitored Patient Re-evaluated:Patient Re-evaluated prior to induction Oxygen Delivery Method: Nasal cannula Placement Confirmation: positive ETCO2

## 2018-10-27 ENCOUNTER — Encounter: Payer: Self-pay | Admitting: Ophthalmology

## 2019-03-16 ENCOUNTER — Other Ambulatory Visit: Payer: Self-pay | Admitting: Internal Medicine

## 2019-03-16 DIAGNOSIS — Z1231 Encounter for screening mammogram for malignant neoplasm of breast: Secondary | ICD-10-CM

## 2019-05-05 ENCOUNTER — Ambulatory Visit
Admission: RE | Admit: 2019-05-05 | Discharge: 2019-05-05 | Disposition: A | Discharge status: Home / Self Care | Payer: Medicare PPO | Source: Ambulatory Visit | Attending: Internal Medicine | Admitting: Internal Medicine

## 2019-05-05 DIAGNOSIS — Z1231 Encounter for screening mammogram for malignant neoplasm of breast: Secondary | ICD-10-CM | POA: Insufficient documentation

## 2020-04-08 ENCOUNTER — Other Ambulatory Visit: Payer: Self-pay | Admitting: Internal Medicine

## 2020-04-08 DIAGNOSIS — Z1231 Encounter for screening mammogram for malignant neoplasm of breast: Secondary | ICD-10-CM

## 2020-05-06 ENCOUNTER — Other Ambulatory Visit: Payer: Self-pay

## 2020-05-06 ENCOUNTER — Ambulatory Visit
Admission: RE | Admit: 2020-05-06 | Discharge: 2020-05-06 | Disposition: A | Discharge status: Home / Self Care | Payer: Medicare PPO | Source: Ambulatory Visit | Attending: Internal Medicine | Admitting: Internal Medicine

## 2020-05-06 DIAGNOSIS — Z1231 Encounter for screening mammogram for malignant neoplasm of breast: Secondary | ICD-10-CM

## 2021-05-05 ENCOUNTER — Other Ambulatory Visit: Payer: Self-pay | Admitting: Internal Medicine

## 2021-05-05 DIAGNOSIS — Z1231 Encounter for screening mammogram for malignant neoplasm of breast: Secondary | ICD-10-CM

## 2021-06-11 ENCOUNTER — Ambulatory Visit
Admission: RE | Admit: 2021-06-11 | Discharge: 2021-06-11 | Disposition: A | Discharge status: Home / Self Care | Payer: Medicare PPO | Source: Ambulatory Visit | Attending: Internal Medicine | Admitting: Internal Medicine

## 2021-06-11 ENCOUNTER — Other Ambulatory Visit: Payer: Self-pay

## 2021-06-11 DIAGNOSIS — Z1231 Encounter for screening mammogram for malignant neoplasm of breast: Secondary | ICD-10-CM | POA: Diagnosis not present

## 2022-03-23 ENCOUNTER — Other Ambulatory Visit: Payer: Self-pay | Admitting: Internal Medicine

## 2022-03-23 DIAGNOSIS — Z1231 Encounter for screening mammogram for malignant neoplasm of breast: Secondary | ICD-10-CM

## 2022-06-15 ENCOUNTER — Ambulatory Visit
Admission: RE | Admit: 2022-06-15 | Discharge: 2022-06-15 | Disposition: A | Discharge status: Home / Self Care | Payer: Medicare PPO | Source: Ambulatory Visit | Attending: Internal Medicine | Admitting: Internal Medicine

## 2022-06-15 DIAGNOSIS — Z1231 Encounter for screening mammogram for malignant neoplasm of breast: Secondary | ICD-10-CM | POA: Diagnosis not present

## 2023-05-03 ENCOUNTER — Other Ambulatory Visit: Payer: Self-pay | Admitting: Internal Medicine

## 2023-05-03 DIAGNOSIS — Z1231 Encounter for screening mammogram for malignant neoplasm of breast: Secondary | ICD-10-CM

## 2023-06-21 ENCOUNTER — Ambulatory Visit
Admission: RE | Admit: 2023-06-21 | Discharge: 2023-06-21 | Disposition: A | Discharge status: Home / Self Care | Payer: Medicare PPO | Source: Ambulatory Visit | Attending: Internal Medicine | Admitting: Internal Medicine

## 2023-06-21 DIAGNOSIS — Z1231 Encounter for screening mammogram for malignant neoplasm of breast: Secondary | ICD-10-CM | POA: Diagnosis present

## 2023-07-30 ENCOUNTER — Encounter: Payer: Self-pay | Admitting: Gastroenterology

## 2023-08-09 ENCOUNTER — Encounter: Payer: Self-pay | Admitting: Gastroenterology

## 2023-08-09 ENCOUNTER — Ambulatory Visit
Admission: RE | Admit: 2023-08-09 | Discharge: 2023-08-09 | Disposition: A | Discharge status: Home / Self Care | Attending: Gastroenterology | Admitting: Gastroenterology

## 2023-08-09 ENCOUNTER — Ambulatory Visit: Admitting: Anesthesiology

## 2023-08-09 ENCOUNTER — Encounter
Admission: RE | Disposition: A | Discharge status: Home / Self Care | Payer: Self-pay | Source: Home / Self Care | Attending: Gastroenterology

## 2023-08-09 DIAGNOSIS — Z1211 Encounter for screening for malignant neoplasm of colon: Secondary | ICD-10-CM | POA: Diagnosis present

## 2023-08-09 DIAGNOSIS — I252 Old myocardial infarction: Secondary | ICD-10-CM | POA: Diagnosis not present

## 2023-08-09 DIAGNOSIS — K5730 Diverticulosis of large intestine without perforation or abscess without bleeding: Secondary | ICD-10-CM | POA: Diagnosis not present

## 2023-08-09 DIAGNOSIS — Z955 Presence of coronary angioplasty implant and graft: Secondary | ICD-10-CM | POA: Diagnosis not present

## 2023-08-09 DIAGNOSIS — D120 Benign neoplasm of cecum: Secondary | ICD-10-CM | POA: Diagnosis not present

## 2023-08-09 DIAGNOSIS — Z800 Family history of malignant neoplasm of digestive organs: Secondary | ICD-10-CM | POA: Diagnosis not present

## 2023-08-09 DIAGNOSIS — I2510 Atherosclerotic heart disease of native coronary artery without angina pectoris: Secondary | ICD-10-CM | POA: Diagnosis not present

## 2023-08-09 DIAGNOSIS — K640 First degree hemorrhoids: Secondary | ICD-10-CM | POA: Insufficient documentation

## 2023-08-09 HISTORY — PX: COLONOSCOPY: SHX5424

## 2023-08-09 HISTORY — DX: Prediabetes: R73.03

## 2023-08-09 HISTORY — DX: Personal history of adenomatous and serrated colon polyps: Z86.0101

## 2023-08-09 HISTORY — PX: POLYPECTOMY: SHX149

## 2023-08-09 HISTORY — DX: ST elevation (STEMI) myocardial infarction involving other coronary artery of inferior wall: I21.19

## 2023-08-09 SURGERY — COLONOSCOPY
Anesthesia: General

## 2023-08-09 MED ORDER — PROPOFOL 10 MG/ML IV BOLUS
INTRAVENOUS | Status: DC | PRN
  Administered 2023-08-09: 50 mg via INTRAVENOUS
  Administered 2023-08-09: 30 mg via INTRAVENOUS

## 2023-08-09 MED ORDER — LIDOCAINE HCL (CARDIAC) PF 100 MG/5ML IV SOSY
PREFILLED_SYRINGE | INTRAVENOUS | Status: DC | PRN
Start: 2023-08-09 — End: 2023-08-09
  Administered 2023-08-09: 60 mg via INTRAVENOUS

## 2023-08-09 MED ORDER — DEXMEDETOMIDINE HCL IN NACL 80 MCG/20ML IV SOLN
INTRAVENOUS | Status: DC | PRN
  Administered 2023-08-09: 12 ug via INTRAVENOUS
  Administered 2023-08-09: 8 ug via INTRAVENOUS

## 2023-08-09 MED ORDER — LIDOCAINE HCL (PF) 2 % IJ SOLN
INTRAMUSCULAR | Status: AC
  Filled 2023-08-09: qty 5

## 2023-08-09 MED ORDER — SODIUM CHLORIDE 0.9 % IV SOLN: INTRAVENOUS | Status: DC

## 2023-08-09 MED ORDER — PROPOFOL 500 MG/50ML IV EMUL
INTRAVENOUS | Status: DC | PRN
  Administered 2023-08-09: 50 ug/kg/min via INTRAVENOUS

## 2023-08-09 NOTE — Anesthesia Preprocedure Evaluation (Addendum)
 Anesthesia Evaluation  Patient identified by MRN, date of birth, ID band Patient awake    Reviewed: Allergy & Precautions, NPO status , Patient's Chart, lab work & pertinent test results  History of Anesthesia Complications Negative for: history of anesthetic complications  Airway Mallampati: IV   Neck ROM: Full    Dental no notable dental hx.    Pulmonary neg pulmonary ROS   Pulmonary exam normal breath sounds clear to auscultation       Cardiovascular + CAD (s/p MI and stent)  Normal cardiovascular exam Rhythm:Regular Rate:Normal     Neuro/Psych negative neurological ROS     GI/Hepatic negative GI ROS,,,  Endo/Other  Obesity   Renal/GU negative Renal ROS     Musculoskeletal   Abdominal   Peds  Hematology negative hematology ROS (+)   Anesthesia Other Findings Cardiology note 07/27/23:  Eppie Walpole is a 73 y.o. female who is seen for follow-up of coronary artery disease. She is a pleasant woman who in 2017 we did a stent in for an acute MI. Since then she is done generally well.  Assessment & Plan Coronary artery disease with stent placement 8 years post-stent placement, asymptomatic, well-controlled blood pressure, stable cholesterol. No heart failure or arrhythmias. Benefits of regular exercise discussed. - Continue aspirin and atorvastatin. - Encourage regular physical activity.  Glaucoma Managed with eye drops. - Continue current glaucoma eye drops.  Disposition: RTC in 1 year   Reproductive/Obstetrics                             Anesthesia Physical Anesthesia Plan  ASA: 2  Anesthesia Plan: General   Post-op Pain Management:    Induction: Intravenous  PONV Risk Score and Plan: 3 and Propofol  infusion, TIVA and Treatment may vary due to age or medical condition  Airway Management Planned: Natural Airway  Additional Equipment:   Intra-op Plan:   Post-operative  Plan:   Informed Consent: I have reviewed the patients History and Physical, chart, labs and discussed the procedure including the risks, benefits and alternatives for the proposed anesthesia with the patient or authorized representative who has indicated his/her understanding and acceptance.       Plan Discussed with: CRNA  Anesthesia Plan Comments: (LMA/GETA backup discussed.  Patient consented for risks of anesthesia including but not limited to:  - adverse reactions to medications - damage to eyes, teeth, lips or other oral mucosa - nerve damage due to positioning  - sore throat or hoarseness - damage to heart, brain, nerves, lungs, other parts of body or loss of life  Informed patient about role of CRNA in peri- and intra-operative care.  Patient voiced understanding.)        Anesthesia Quick Evaluation

## 2023-08-09 NOTE — Op Note (Signed)
 [facility] Gastroenterology Patient Name: Autumn Combs Procedure Date: 08/09/2023 10:37 AM MRN: [id number] Account #: [id number] Date of Birth: 06-19-1950 Admit Type: Outpatient Age: 73 Room: [facility] ENDO ROOM 2 Gender: Female Note Status: Finalized Instrument Name: Peds Colonoscope [id number] Procedure:             Colonoscopy Indications:           High risk colon cancer surveillance: Personal history                         of adenoma with villous component Providers:             Bridgett Camps, DO Referring MD:          Huel Madison. Alva Jewels MD, MD (Referring MD) Medicines:             Monitored Anesthesia Care Complications:         No immediate complications. Estimated blood loss:                         Minimal. Procedure:             Pre-Anesthesia Assessment:                        - Prior to the procedure, a History and Physical was                         performed, and patient medications and allergies were                         reviewed. The patient is competent. The risks and                         benefits of the procedure and the sedation options and                         risks were discussed with the patient. All questions                         were answered and informed consent was obtained.                         Patient identification and proposed procedure were                         verified by the physician, the nurse, the anesthetist                         and the technician in the endoscopy suite. Mental                         Status Examination: alert and oriented. Airway                         Examination: normal oropharyngeal airway and neck                         mobility. Respiratory Examination: clear to  auscultation. CV Examination: RRR, no murmurs, no S3                         or S4. Prophylactic Antibiotics: The patient does not                         require prophylactic antibiotics. Prior                          Anticoagulants: The patient has taken no anticoagulant                         or antiplatelet agents. ASA Grade Assessment: II - A                         patient with mild systemic disease. After reviewing                         the risks and benefits, the patient was deemed in                         satisfactory condition to undergo the procedure. The                         anesthesia plan was to use monitored anesthesia care                         (MAC). Immediately prior to administration of                         medications, the patient was re-assessed for adequacy                         to receive sedatives. The heart rate, respiratory                         rate, oxygen saturations, blood pressure, adequacy of                         pulmonary ventilation, and response to care were                         monitored throughout the procedure. The physical                         status of the patient was re-assessed after the                         procedure.                        After obtaining informed consent, the colonoscope was                         passed under direct vision. Throughout the procedure,                         the patient's blood pressure, pulse, and oxygen  saturations were monitored continuously. The                         Colonoscope was introduced through the anus and                         advanced to the the terminal ileum, with                         identification of the appendiceal orifice and IC                         valve. The colonoscopy was somewhat difficult due to a                         tortuous colon. Successful completion of the procedure                         was aided by applying abdominal pressure and lavage.                         The patient tolerated the procedure well. The quality                         of the bowel preparation was evaluated using the BBPS                          [facility] Bowel Preparation Scale) with scores of: Right                         Colon = 3, Transverse Colon = 3 and Left Colon = 3                         (entire mucosa seen well with no residual staining,                         small fragments of stool or opaque liquid). The total                         BBPS score equals 9. The terminal ileum, ileocecal                         valve, appendiceal orifice, and rectum were                         photographed. Findings:      The perianal and digital rectal examinations were normal. Pertinent       negatives include normal sphincter tone.      The terminal ileum appeared normal. Estimated blood loss: none.      A 1 to 2 mm polyp was found in the cecum. The polyp was sessile. The       polyp was removed with a jumbo cold forceps. Resection and retrieval       were complete. Estimated blood loss was minimal.      Multiple small-mouthed diverticula were found in the left colon.       Estimated blood loss: none.  Non-bleeding internal hemorrhoids were found during retroflexion. The       hemorrhoids were Grade I (internal hemorrhoids that do not prolapse).       Estimated blood loss: none.      The recto-sigmoid colon was moderately tortuous. Estimated blood loss:       none.      The exam was otherwise without abnormality on direct and retroflexion       views. Impression:            - The examined portion of the ileum was normal.                        - One 1 to 2 mm polyp in the cecum, removed with a                         jumbo cold forceps. Resected and retrieved.                        - Diverticulosis in the left colon.                        - Non-bleeding internal hemorrhoids.                        - Tortuous colon.                        - The examination was otherwise normal on direct and                         retroflexion views. Recommendation:        - Patient has a contact number available for                          emergencies. The signs and symptoms of potential                         delayed complications were discussed with the patient.                         Return to normal activities tomorrow. Written                         discharge instructions were provided to the patient.                        - Discharge patient to home.                        - Resume previous diet.                        - Continue present medications.                        - Await pathology results.                        - Repeat colonoscopy for surveillance based on  pathology results.                        - Return to referring physician as previously                         scheduled.                        - The findings and recommendations were discussed with                         the patient. Procedure Code(s):     --- Professional ---                        [phone], Colonoscopy, flexible; with biopsy, single or                         multiple Diagnosis Code(s):     --- Professional ---                        Z86.010, Personal history of colonic polyps                        K64.0, First degree hemorrhoids                        D12.0, Benign neoplasm of cecum                        K57.30, Diverticulosis of large intestine without                         perforation or abscess without bleeding                        Q43.8, Other specified congenital malformations of                         intestine CPT copyright 2022 American Medical Association. All rights reserved. The codes documented in this report are preliminary and upon coder review may  be revised to meet current compliance requirements. Attending Participation:      I personally performed the entire procedure. Polo Brisk, DO Quintin Buckle DO, DO 08/09/2023 11:20:40 AM This report has been signed electronically. Number of Addenda: 0 Note Initiated On: 08/09/2023 10:37 AM Scope Withdrawal Time: 0  hours 11 minutes 45 seconds  Total Procedure Duration: 0 hours 20 minutes 28 seconds  Estimated Blood Loss:  Estimated blood loss was minimal.      [facility]

## 2023-08-09 NOTE — Anesthesia Postprocedure Evaluation (Signed)
 Anesthesia Post Note  Patient: LAASIA CARMOSINO  Procedure(s) Performed: COLONOSCOPY POLYPECTOMY, INTESTINE  Patient location during evaluation: PACU Anesthesia Type: General Level of consciousness: awake and alert, oriented and patient cooperative Pain management: pain level controlled Vital Signs Assessment: post-procedure vital signs reviewed and stable Respiratory status: spontaneous breathing, nonlabored ventilation and respiratory function stable Cardiovascular status: blood pressure returned to baseline and stable Postop Assessment: adequate PO intake Anesthetic complications: no   No notable events documented.   Last Vitals:  Vitals:   08/09/23 1127 08/09/23 1137  BP: (!) 100/56 (!) 98/57  Pulse: 73 67  Resp: 18 18  Temp:    SpO2: 96% 100%    Last Pain:  Vitals:   08/09/23 1137  TempSrc:   PainSc: 0-No pain                 Dorothey Gate

## 2023-08-09 NOTE — H&P (Signed)
 Pre-Procedure H&P   Patient ID: Autumn Combs is a 73 y.o. female.  Gastroenterology Provider: Quintin Buckle, DO  Referring Provider: Rodena Citron, NP PCP: Jimmy Moulding, MD  Date: 08/09/2023  HPI Ms. Autumn Combs is a 73 y.o. female who presents today for Colonoscopy for Personal history of colon polyps .  Patient has been advanced adenoma in the rectum in 2004 measuring 15 mm, pedunculated.  Tubulovillous on pathology.  Repeat colonoscopies in 2019 2013 2007 demonstrating internal hemorrhoids and diverticulosis.  1 TA in 2007  No current GI complaints.  Maternal grandmother with colorectal cancer   Past Medical History:  Diagnosis Date   Coronary artery disease    Dental bridge present    Permanent bottom retainer.  No bridge   Glaucoma    Hx of adenomatous colonic polyps    Hyperlipidemia    Myocardial infarction (HCC)    Pre-diabetes    ST elevation myocardial infarction (STEMI) of inferior wall [facility])     Past Surgical History:  Procedure Laterality Date   CARDIAC CATHETERIZATION  07/2015   DES placed    CATARACT EXTRACTION W/PHACO Left 10/26/2018   Procedure: CATARACT EXTRACTION PHACO AND INTRAOCULAR LENS PLACEMENT (IOC) LEFT TORIC LENS;  Surgeon: Annell Kidney, MD;  Location: [facility];  Service: Ophthalmology;  Laterality: Left;   CHOLECYSTECTOMY     COLONOSCOPY     COLONOSCOPY WITH PROPOFOL  N/A 11/17/2017   Procedure: COLONOSCOPY WITH PROPOFOL ;  Surgeon: Cassie Click, MD;  Location: [facility] END[facility];  Service: Endoscopy;  Laterality: N/A;  Hx:  STEMI, CAD   CORONARY ANGIOPLASTY  2017   stent   ERCP     EYE SURGERY     LIPOMA EXCISION      Family History Maternal grandmother with colorectal cancer No other h/o GI disease or malignancy  Review of Systems  Constitutional:  Negative for activity change, appetite change, chills, diaphoresis, fatigue, fever and unexpected weight change.  HENT:  Negative for trouble swallowing  and voice change.   Respiratory:  Negative for shortness of breath and wheezing.   Cardiovascular:  Negative for chest pain, palpitations and leg swelling.  Gastrointestinal:  Negative for abdominal distention, abdominal pain, anal bleeding, blood in stool, constipation, diarrhea, nausea, rectal pain and vomiting.  Musculoskeletal:  Negative for arthralgias and myalgias.  Skin:  Negative for color change and pallor.  Neurological:  Negative for dizziness, syncope and weakness.  Psychiatric/Behavioral:  Negative for confusion.   All other systems reviewed and are negative.    Medications No current facility-administered medications on file prior to encounter.   Current Outpatient Medications on File Prior to Encounter  Medication Sig Dispense Refill   aspirin EC 81 MG tablet Take 81 mg by mouth daily.     atorvastatin (LIPITOR) 80 MG tablet Take 80 mg by mouth daily.     bimatoprost (LUMIGAN) 0.03 % ophthalmic solution 1 drop at bedtime.     BIOTIN PO Take by mouth.     calcium carbonate (OS-CAL - DOSED IN MG OF ELEMENTAL CALCIUM) 1250 (500 Ca) MG tablet Take 1 tablet by mouth daily.     nitroGLYCERIN (NITROSTAT) 0.4 MG SL tablet Place 0.4 mg under the tongue every 5 (five) minutes as needed for chest pain.      Pertinent medications related to GI and procedure were reviewed by me with the patient prior to the procedure   Current Facility-Administered Medications:    0.9 %  sodium chloride  infusion, ,  Intravenous, Continuous, Quintin Buckle, DO, Last Rate: 20 mL/hr at 08/09/23 0956, New Bag at 08/09/23 0956  sodium chloride  20 mL/hr at 08/09/23 1610       Allergies  Allergen Reactions   Lisinopril Cough   Cleocin [Clindamycin Hcl] Swelling and Rash   Lincocin [Lincomycin] Swelling and Rash   Penicillins Swelling and Rash   Allergies were reviewed by me prior to the procedure  Objective   Body mass index is 33.63 kg/m. Vitals:   08/09/23 0946  BP: 123/64  Pulse:  64  Resp: 18  Temp: (!) 96.8 F (36 C)  TempSrc: Temporal  SpO2: 100%  Weight: 100.3 kg     Physical Exam Vitals and nursing note reviewed.  Constitutional:      General: She is not in acute distress.    Appearance: Normal appearance. She is not ill-appearing, toxic-appearing or diaphoretic.  HENT:     Head: Normocephalic and atraumatic.     Nose: Nose normal.     Mouth/Throat:     Mouth: Mucous membranes are moist.     Pharynx: Oropharynx is clear.  Eyes:     General: No scleral icterus.    Extraocular Movements: Extraocular movements intact.  Cardiovascular:     Rate and Rhythm: Normal rate and regular rhythm.     Heart sounds: Normal heart sounds. No murmur heard.    No friction rub. No gallop.  Pulmonary:     Effort: Pulmonary effort is normal. No respiratory distress.     Breath sounds: Normal breath sounds. No wheezing, rhonchi or rales.  Abdominal:     General: Bowel sounds are normal. There is no distension.     Palpations: Abdomen is soft.     Tenderness: There is no abdominal tenderness. There is no guarding or rebound.  Musculoskeletal:     Cervical back: Neck supple.     Right lower leg: No edema.     Left lower leg: No edema.  Skin:    General: Skin is warm and dry.     Coloration: Skin is not jaundiced or pale.  Neurological:     General: No focal deficit present.     Mental Status: She is alert and oriented to person, place, and time. Mental status is at baseline.  Psychiatric:        Mood and Affect: Mood normal.        Behavior: Behavior normal.        Thought Content: Thought content normal.        Judgment: Judgment normal.      Assessment:  Ms. Autumn Combs is a 73 y.o. female  who presents today for Colonoscopy for phx colon polyps.  Plan:  Colonoscopy with possible intervention today  Colonoscopy with possible biopsy, control of bleeding, polypectomy, and interventions as necessary has been discussed with the patient/patient  representative. Informed consent was obtained from the patient/patient representative after explaining the indication, nature, and risks of the procedure including but not limited to death, bleeding, perforation, missed neoplasm/lesions, cardiorespiratory compromise, and reaction to medications. Opportunity for questions was given and appropriate answers were provided. Patient/patient representative has verbalized understanding is amenable to undergoing the procedure.   Quintin Buckle, DO  [facility] Gastroenterology  Portions of the record may have been created with voice recognition software. Occasional wrong-word or 'sound-a-like' substitutions may have occurred due to the inherent limitations of voice recognition software.  Read the chart carefully and recognize, using context, where substitutions may  have occurred.

## 2023-08-09 NOTE — Interval H&P Note (Signed)
 History and Physical Interval Note: Preprocedure H&P from 08/09/23  was reviewed and there was no interval change after seeing and examining the patient.  Written consent was obtained from the patient after discussion of risks, benefits, and alternatives. Patient has consented to proceed with Colonoscopy with possible intervention   08/09/2023 10:44 AM  Autumn Combs  has presented today for surgery, with the diagnosis of Hx of adenomatous colonic polyps (Z86.0101).  The various methods of treatment have been discussed with the patient and family. After consideration of risks, benefits and other options for treatment, the patient has consented to  Procedure(s): COLONOSCOPY (N/A) as a surgical intervention.  The patient's history has been reviewed, patient examined, no change in status, stable for surgery.  I have reviewed the patient's chart and labs.  Questions were answered to the patient's satisfaction.     Quintin Buckle

## 2023-08-09 NOTE — Transfer of Care (Signed)
 Immediate Anesthesia Transfer of Care Note  Patient: Autumn Combs  Procedure(s) Performed: COLONOSCOPY POLYPECTOMY, INTESTINE  Patient Location: PACU  Anesthesia Type:General  Level of Consciousness: sedated  Airway & Oxygen Therapy: Patient Spontanous Breathing  Post-op Assessment: Report given to RN and Post -op Vital signs reviewed and stable  Post vital signs: Reviewed and stable  Last Vitals:  Vitals Value Taken Time  BP 87/51 08/09/23 1119  Temp 36.1 C 08/09/23 1117  Pulse 59 08/09/23 1120  Resp 13 08/09/23 1120  SpO2 95 % 08/09/23 1120  Vitals shown include unfiled device data.  Last Pain:  Vitals:   08/09/23 1117  TempSrc: Tympanic  PainSc: Asleep         Complications: No notable events documented.

## 2023-08-11 LAB — SURGICAL PATHOLOGY
